# Patient Record
Sex: Male | Born: 1970 | Race: White | Hispanic: No | Marital: Married | Smoking: Current every day smoker
Health system: Southern US, Community
[De-identification: ages and names within clinical notes are randomized; demographics above are authoritative.]

## PROBLEM LIST (undated history)

## (undated) DIAGNOSIS — R51 Headache: Secondary | ICD-10-CM

## (undated) DIAGNOSIS — R519 Headache, unspecified: Secondary | ICD-10-CM

---

## 1994-02-21 HISTORY — PX: HERNIA REPAIR: SHX51

## 2007-02-22 HISTORY — PX: BACK SURGERY: SHX140

## 2007-07-10 ENCOUNTER — Ambulatory Visit (HOSPITAL_COMMUNITY): Admission: RE | Admit: 2007-07-10 | Discharge: 2007-07-11 | Payer: Self-pay | Admitting: Neurosurgery

## 2010-07-06 NOTE — Op Note (Signed)
NAMEREMBERT, BROWE               ACCOUNT NO.:  [id number]   MEDICAL RECORD NO.:  [id number]          PATIENT TYPE:  OIB   LOCATION:  3599                         FACILITY:  [facility]   PHYSICIAN:  Reinaldo Meeker, M.D. DATE OF BIRTH:  10-27-1970   DATE OF PROCEDURE:  07/10/2007  DATE OF DISCHARGE:                               OPERATIVE REPORT   PREOPERATIVE DIAGNOSIS:  Herniated disk L5-S1, right.   POSTOPERATIVE DIAGNOSIS:  Herniated disk L5-S1, right.   PROCEDURE:  Right L5-S1 interlaminar laminotomy for excision of  herniated disk with operative microscope.   SECONDARY PROCEDURE:  Microdissection, L5-S1 disk and S1 nerve root.   SURGEON:  Reinaldo Meeker, MD   ASSISTANT:  Kathaleen Maser. Pool, MD   PROCEDURE IN DETAIL:  After placing in the prone position, the patient's  back was prepped and draped in the usual sterile fashion.  Localizing x-  ray was taken prior to incision to identify the appropriate level.  Midline incision was made above the spinous processes of L5 and S1.  Using the Bovie cutting current, the incision was carried on the spinous  processes.  Subperiosteal dissection was then carried out along the  right side spinous processes and lamina.  Self-retaining retractor was  placed for exposure.  X-ray showed approach to the appropriate level.  Using a high-speed drill, the inferior one-third of the L5 lamina, the  medial one-third of the facet joint, and the superior one-third of the  S1 lamina were removed.  Residual bone ligamentum flavum removed in a  piecemeal fashion.  The microscope was draped and brought into the field  and used for remainder of the case.  Using microdissection technique,  the lateral aspect of the thecal sac and S1 nerve root were identified.  Further coagulation was carried down in the floor of the canal to  identify the L5-S1 disk, which was found remarkably herniated.  After  coagulating on the annulus, this was incised with #15-blade.   Using  pituitary rongeurs and curettes, thorough disk space clean-out was  carried out, at the same time great care was taken to avoid injury to  the neural elements.  This was successfully done.  At this point,  inspection was carried out in all directions for any evidence of  residual compression and none could be identified.  Large amounts of  irrigation was carried out.  Any bleeding was controlled with bipolar  coagulation and Gelfoam.  The wound was then closed in multiple layers  of Vicryl in the muscle fascia, subcutaneous, and subcuticular tissues,  and staples were placed on the skin.  A sterile dressing was then  applied, and the patient was extubated and taken recovery room in stable  condition.           ______________________________  Reinaldo Meeker, M.D.     ROK/MEDQ  D:  07/10/2007  T:  07/11/2007  Job:  [id number]

## 2015-03-25 ENCOUNTER — Other Ambulatory Visit: Payer: Self-pay | Admitting: Neurosurgery

## 2015-04-20 ENCOUNTER — Encounter (HOSPITAL_COMMUNITY): Payer: Self-pay

## 2015-04-20 ENCOUNTER — Encounter (HOSPITAL_COMMUNITY)
Admission: RE | Admit: 2015-04-20 | Discharge: 2015-04-20 | Disposition: A | Discharge status: Home / Self Care | Source: Ambulatory Visit | Attending: Neurosurgery | Admitting: Neurosurgery

## 2015-04-20 DIAGNOSIS — Z01812 Encounter for preprocedural laboratory examination: Secondary | ICD-10-CM | POA: Insufficient documentation

## 2015-04-20 DIAGNOSIS — M5127 Other intervertebral disc displacement, lumbosacral region: Secondary | ICD-10-CM | POA: Insufficient documentation

## 2015-04-20 HISTORY — DX: Headache, unspecified: R51.9

## 2015-04-20 HISTORY — DX: Headache: R51

## 2015-04-20 LAB — CBC
HCT: 44.1 % (ref 39.0–52.0)
Hemoglobin: 15.3 g/dL (ref 13.0–17.0)
MCH: 30.5 pg (ref 26.0–34.0)
MCHC: 34.7 g/dL (ref 30.0–36.0)
MCV: 87.8 fL (ref 78.0–100.0)
PLATELETS: 231 10*3/uL (ref 150–400)
RBC: 5.02 MIL/uL (ref 4.22–5.81)
RDW: 13.4 % (ref 11.5–15.5)
WBC: 8.3 10*3/uL (ref 4.0–10.5)

## 2015-04-20 LAB — BASIC METABOLIC PANEL
ANION GAP: 10 (ref 5–15)
BUN: 11 mg/dL (ref 6–20)
CALCIUM: 9.2 mg/dL (ref 8.9–10.3)
CO2: 25 mmol/L (ref 22–32)
Chloride: 106 mmol/L (ref 101–111)
Creatinine, Ser: 1.03 mg/dL (ref 0.61–1.24)
GFR calc Af Amer: >60 mL/min (ref >60)
GLUCOSE: 93 mg/dL (ref 65–99)
POTASSIUM: 4.2 mmol/L (ref 3.5–5.1)
SODIUM: 141 mmol/L (ref 135–145)

## 2015-04-20 LAB — SURGICAL PCR SCREEN
MRSA, PCR: NEGATIVE
STAPHYLOCOCCUS AUREUS: NEGATIVE

## 2015-04-20 NOTE — Progress Notes (Signed)
PCP is Dr. Madilyn Fireman Denies seeing a cardiologist denies ever having a stress test, card cath, or echo Ekg copy placed in chart

## 2015-04-20 NOTE — Pre-Procedure Instructions (Signed)
Nathan Hardin  04/20/2015      [facility] [place]in, [place] - [address] [facility] [place] [address] Phone: [phone] Fax: [phone]    Your procedure is scheduled on March 7.  Report to [facility] Admitting at 800 A.M.  Call this number if you have problems the morning of surgery:  [phone]   Remember:  Do not eat food or drink liquids after midnight.  Take these medicines the morning of surgery with A SIP OF WATER Hydrocodone or tylenol if needed   Stop taking Asprin, Ibuprofen, Advil, Motrin, Aleve, BC's, Goody's, Herbal medications,Fish oil    Do not wear jewelry, make-up or nail polish.  Do not wear lotions, powders, or perfumes.  You may wear deodorant.  Do not shave 48 hours prior to surgery.  Men may shave face and neck.  Do not bring valuables to the hospital.  [name] is not responsible for any belongings or valuables.  Contacts, dentures or bridgework may not be worn into surgery.  Leave your suitcase in the car.  After surgery it may be brought to your room.  For patients admitted to the hospital, discharge time will be determined by your treatment team.  Patients discharged the day of surgery will not be allowed to drive home.    Special instructions:  [name] - Preparing for Surgery  Before surgery, you can play an important role.  Because skin is not sterile, your skin needs to be as free of germs as possible.  You can reduce the number of germs on you skin by washing with CHG (chlorahexidine gluconate) soap before surgery.  CHG is an antiseptic cleaner which kills germs and bonds with the skin to continue killing germs even after washing.  Please DO NOT use if you have an allergy to CHG or antibacterial soaps.  If your skin becomes reddened/irritated stop using the CHG and inform your nurse when you arrive at Short Stay.  Do not shave (including legs and underarms) for at least 48  hours prior to the first CHG shower.  You may shave your face.  Please follow these instructions carefully:   1.  Shower with CHG Soap the night before surgery and the    morning of Surgery.  2.  If you choose to wash your hair, wash your hair first as usual with your  normal shampoo.  3.  After you shampoo, rinse your hair and body thoroughly to remove the  Shampoo.  4.  Use CHG as you would any other liquid soap.  You can apply chg directly    to the skin and wash gently with scrungie or a clean washcloth.  5.  Apply the CHG Soap to your body ONLY FROM THE NECK DOWN.        Do not use on open wounds or open sores.  Avoid contact with your eyes,  ears, mouth and genitals (private parts).  Wash genitals (private parts)  with your normal soap.  6.  Wash thoroughly, paying special attention to the area where your surgery  will be performed.  7.  Thoroughly rinse your body with warm water from the neck down.  8.  DO NOT shower/wash with your normal soap after using and rinsing off  the CHG Soap.  9.  Pat yourself dry with a clean towel.            10.  Wear clean pajamas.  11.  Place clean sheets on your bed the night of your first shower and do not  sleep with pets.  Day of Surgery  Do not apply any lotions/deoderants the morning of surgery.  Please wear clean clothes to the hospital/surgery center.     Please read over the following fact sheets that you were given. Pain Booklet, Coughing and Deep Breathing, MRSA Information and Surgical Site Infection Prevention

## 2015-04-20 NOTE — Progress Notes (Signed)
Not here for PAT appointment message left for pt to return call.

## 2015-04-27 NOTE — Progress Notes (Signed)
Patient notified to arrive at 411.

## 2015-04-28 ENCOUNTER — Ambulatory Visit (HOSPITAL_COMMUNITY): Admitting: Certified Registered Nurse Anesthetist

## 2015-04-28 ENCOUNTER — Observation Stay (HOSPITAL_COMMUNITY)
Admission: RE | Admit: 2015-04-28 | Discharge: 2015-04-28 | Disposition: A | Discharge status: Home / Self Care | Source: Ambulatory Visit | Attending: Neurosurgery | Admitting: Neurosurgery

## 2015-04-28 ENCOUNTER — Encounter (HOSPITAL_COMMUNITY)
Admission: RE | Disposition: A | Discharge status: Home / Self Care | Payer: Self-pay | Source: Ambulatory Visit | Attending: Neurosurgery

## 2015-04-28 ENCOUNTER — Ambulatory Visit (HOSPITAL_COMMUNITY)

## 2015-04-28 ENCOUNTER — Encounter (HOSPITAL_COMMUNITY): Payer: Self-pay | Admitting: *Deleted

## 2015-04-28 DIAGNOSIS — M5126 Other intervertebral disc displacement, lumbar region: Secondary | ICD-10-CM | POA: Diagnosis present

## 2015-04-28 DIAGNOSIS — M5117 Intervertebral disc disorders with radiculopathy, lumbosacral region: Secondary | ICD-10-CM | POA: Diagnosis not present

## 2015-04-28 DIAGNOSIS — F17210 Nicotine dependence, cigarettes, uncomplicated: Secondary | ICD-10-CM | POA: Insufficient documentation

## 2015-04-28 DIAGNOSIS — Z419 Encounter for procedure for purposes other than remedying health state, unspecified: Secondary | ICD-10-CM

## 2015-04-28 HISTORY — PX: LUMBAR LAMINECTOMY/DECOMPRESSION MICRODISCECTOMY: SHX5026

## 2015-04-28 SURGERY — LUMBAR LAMINECTOMY/DECOMPRESSION MICRODISCECTOMY 1 LEVEL
Anesthesia: General | Site: Back | Laterality: Right

## 2015-04-28 MED ORDER — CYCLOBENZAPRINE HCL 10 MG PO TABS: 10.0000 mg | ORAL_TABLET | Freq: Three times a day (TID) | ORAL | Status: AC | PRN

## 2015-04-28 MED ORDER — ONDANSETRON HCL 4 MG/2ML IJ SOLN
INTRAMUSCULAR | Status: AC
  Filled 2015-04-28: qty 2

## 2015-04-28 MED ORDER — LACTATED RINGERS IV SOLN: INTRAVENOUS | Status: DC

## 2015-04-28 MED ORDER — FENTANYL CITRATE (PF) 250 MCG/5ML IJ SOLN
INTRAMUSCULAR | Status: AC
  Filled 2015-04-28: qty 5

## 2015-04-28 MED ORDER — ACETAMINOPHEN 325 MG PO TABS: 650.0000 mg | ORAL_TABLET | ORAL | Status: DC | PRN

## 2015-04-28 MED ORDER — SODIUM CHLORIDE 0.9 % IR SOLN
Status: DC | PRN
  Administered 2015-04-28: 13:00:00

## 2015-04-28 MED ORDER — HYDROMORPHONE HCL 1 MG/ML IJ SOLN
0.2500 mg | INTRAMUSCULAR | Status: DC | PRN
  Administered 2015-04-28: 0.5 mg via INTRAVENOUS

## 2015-04-28 MED ORDER — KETOROLAC TROMETHAMINE 30 MG/ML IJ SOLN
INTRAMUSCULAR | Status: DC | PRN
  Administered 2015-04-28: 30 mg via INTRAVENOUS

## 2015-04-28 MED ORDER — DEXAMETHASONE SODIUM PHOSPHATE 10 MG/ML IJ SOLN
INTRAMUSCULAR | Status: AC
  Filled 2015-04-28: qty 1

## 2015-04-28 MED ORDER — CEFAZOLIN SODIUM-DEXTROSE 2-3 GM-% IV SOLR
2.0000 g | INTRAVENOUS | Status: AC
  Administered 2015-04-28: 2 g via INTRAVENOUS

## 2015-04-28 MED ORDER — SODIUM CHLORIDE 0.9% FLUSH: 3.0000 mL | INTRAVENOUS | Status: DC | PRN

## 2015-04-28 MED ORDER — CYCLOBENZAPRINE HCL 10 MG PO TABS: 10.0000 mg | ORAL_TABLET | Freq: Three times a day (TID) | ORAL | Status: DC | PRN

## 2015-04-28 MED ORDER — PROPOFOL 10 MG/ML IV BOLUS
INTRAVENOUS | Status: AC
  Filled 2015-04-28: qty 20

## 2015-04-28 MED ORDER — HYDROCODONE-ACETAMINOPHEN 5-325 MG PO TABS: 1.0000 | ORAL_TABLET | Freq: Four times a day (QID) | ORAL | Status: AC | PRN

## 2015-04-28 MED ORDER — LIDOCAINE HCL (CARDIAC) 20 MG/ML IV SOLN
INTRAVENOUS | Status: AC
  Filled 2015-04-28: qty 5

## 2015-04-28 MED ORDER — GLYCOPYRROLATE 0.2 MG/ML IJ SOLN
INTRAMUSCULAR | Status: AC
  Filled 2015-04-28: qty 1

## 2015-04-28 MED ORDER — THROMBIN 5000 UNITS EX SOLR
CUTANEOUS | Status: DC | PRN
  Administered 2015-04-28 (×2): 5000 [IU] via TOPICAL

## 2015-04-28 MED ORDER — MENTHOL 3 MG MT LOZG: 1.0000 | LOZENGE | OROMUCOSAL | Status: DC | PRN

## 2015-04-28 MED ORDER — HYDROMORPHONE HCL 1 MG/ML IJ SOLN: 0.5000 mg | INTRAMUSCULAR | Status: DC | PRN

## 2015-04-28 MED ORDER — LACTATED RINGERS IV SOLN
INTRAVENOUS | Status: DC
  Administered 2015-04-28 (×2): via INTRAVENOUS

## 2015-04-28 MED ORDER — KETOROLAC TROMETHAMINE 30 MG/ML IJ SOLN: 30.0000 mg | Freq: Four times a day (QID) | INTRAMUSCULAR | Status: DC

## 2015-04-28 MED ORDER — LIDOCAINE HCL (CARDIAC) 20 MG/ML IV SOLN
INTRAVENOUS | Status: DC | PRN
  Administered 2015-04-28: 100 mg via INTRAVENOUS

## 2015-04-28 MED ORDER — FENTANYL CITRATE (PF) 100 MCG/2ML IJ SOLN
INTRAMUSCULAR | Status: DC | PRN
  Administered 2015-04-28: 100 ug via INTRAVENOUS
  Administered 2015-04-28 (×3): 50 ug via INTRAVENOUS

## 2015-04-28 MED ORDER — HYDROMORPHONE HCL 1 MG/ML IJ SOLN
INTRAMUSCULAR | Status: AC
  Filled 2015-04-28: qty 1

## 2015-04-28 MED ORDER — SUGAMMADEX SODIUM 200 MG/2ML IV SOLN
INTRAVENOUS | Status: AC
  Filled 2015-04-28: qty 2

## 2015-04-28 MED ORDER — OXYCODONE-ACETAMINOPHEN 5-325 MG PO TABS
1.0000 | ORAL_TABLET | ORAL | Status: DC | PRN
  Administered 2015-04-28: 2 via ORAL
  Filled 2015-04-28: qty 2

## 2015-04-28 MED ORDER — ROCURONIUM BROMIDE 50 MG/5ML IV SOLN
INTRAVENOUS | Status: AC
  Filled 2015-04-28: qty 1

## 2015-04-28 MED ORDER — SODIUM CHLORIDE 0.9% FLUSH: 3.0000 mL | Freq: Two times a day (BID) | INTRAVENOUS | Status: DC

## 2015-04-28 MED ORDER — CEFAZOLIN SODIUM 1-5 GM-% IV SOLN: 1.0000 g | Freq: Three times a day (TID) | INTRAVENOUS | Status: DC

## 2015-04-28 MED ORDER — ARTIFICIAL TEARS OP OINT
TOPICAL_OINTMENT | OPHTHALMIC | Status: AC
  Filled 2015-04-28: qty 3.5

## 2015-04-28 MED ORDER — PROPOFOL 10 MG/ML IV BOLUS
INTRAVENOUS | Status: DC | PRN
  Administered 2015-04-28: 200 mg via INTRAVENOUS
  Administered 2015-04-28 (×2): 50 mg via INTRAVENOUS

## 2015-04-28 MED ORDER — SUGAMMADEX SODIUM 200 MG/2ML IV SOLN
INTRAVENOUS | Status: DC | PRN
  Administered 2015-04-28: 170 mg via INTRAVENOUS

## 2015-04-28 MED ORDER — DIPHENHYDRAMINE HCL 50 MG/ML IJ SOLN
INTRAMUSCULAR | Status: AC
  Filled 2015-04-28: qty 1

## 2015-04-28 MED ORDER — ROCURONIUM BROMIDE 100 MG/10ML IV SOLN
INTRAVENOUS | Status: DC | PRN
  Administered 2015-04-28: 50 mg via INTRAVENOUS

## 2015-04-28 MED ORDER — PROPOFOL 10 MG/ML IV BOLUS
INTRAVENOUS | Status: AC
  Filled 2015-04-28: qty 40

## 2015-04-28 MED ORDER — MIDAZOLAM HCL 2 MG/2ML IJ SOLN
INTRAMUSCULAR | Status: AC
  Filled 2015-04-28: qty 2

## 2015-04-28 MED ORDER — PHENOL 1.4 % MT LIQD: 1.0000 | OROMUCOSAL | Status: DC | PRN

## 2015-04-28 MED ORDER — 0.9 % SODIUM CHLORIDE (POUR BTL) OPTIME
TOPICAL | Status: DC | PRN
  Administered 2015-04-28: 1000 mL

## 2015-04-28 MED ORDER — ONDANSETRON HCL 4 MG/2ML IJ SOLN: 4.0000 mg | INTRAMUSCULAR | Status: DC | PRN

## 2015-04-28 MED ORDER — ARTIFICIAL TEARS OP OINT
TOPICAL_OINTMENT | OPHTHALMIC | Status: AC
  Filled 2015-04-28: qty 7

## 2015-04-28 MED ORDER — CEFAZOLIN SODIUM-DEXTROSE 2-3 GM-% IV SOLR
INTRAVENOUS | Status: AC
  Filled 2015-04-28: qty 50

## 2015-04-28 MED ORDER — DIPHENHYDRAMINE HCL 50 MG/ML IJ SOLN
INTRAMUSCULAR | Status: DC | PRN
  Administered 2015-04-28: 25 mg via INTRAVENOUS

## 2015-04-28 MED ORDER — DEXAMETHASONE SODIUM PHOSPHATE 10 MG/ML IJ SOLN
INTRAMUSCULAR | Status: DC | PRN
  Administered 2015-04-28: 10 mg via INTRAVENOUS

## 2015-04-28 MED ORDER — ARTIFICIAL TEARS OP OINT
TOPICAL_OINTMENT | OPHTHALMIC | Status: DC | PRN
  Administered 2015-04-28: 1 via OPHTHALMIC

## 2015-04-28 MED ORDER — KETOROLAC TROMETHAMINE 30 MG/ML IJ SOLN
INTRAMUSCULAR | Status: AC
  Filled 2015-04-28: qty 1

## 2015-04-28 MED ORDER — HYDROCODONE-ACETAMINOPHEN 5-325 MG PO TABS: 1.0000 | ORAL_TABLET | ORAL | Status: DC | PRN

## 2015-04-28 MED ORDER — ONDANSETRON HCL 4 MG/2ML IJ SOLN
INTRAMUSCULAR | Status: DC | PRN
  Administered 2015-04-28: 4 mg via INTRAVENOUS

## 2015-04-28 MED ORDER — MIDAZOLAM HCL 5 MG/5ML IJ SOLN
INTRAMUSCULAR | Status: DC | PRN
  Administered 2015-04-28: 2 mg via INTRAVENOUS

## 2015-04-28 MED ORDER — ACETAMINOPHEN 650 MG RE SUPP: 650.0000 mg | RECTAL | Status: DC | PRN

## 2015-04-28 MED ORDER — HEMOSTATIC AGENTS (NO CHARGE) OPTIME
TOPICAL | Status: DC | PRN
  Administered 2015-04-28: 1 via TOPICAL

## 2015-04-28 MED ORDER — BUPIVACAINE HCL (PF) 0.25 % IJ SOLN
INTRAMUSCULAR | Status: DC | PRN
  Administered 2015-04-28: 20 mL

## 2015-04-28 SURGICAL SUPPLY — 49 items
BAG DECANTER FOR FLEXI CONT (MISCELLANEOUS) ×3 IMPLANT
BENZOIN TINCTURE PRP APPL 2/3 (GAUZE/BANDAGES/DRESSINGS) ×3 IMPLANT
BLADE CLIPPER SURG (BLADE) IMPLANT
BRUSH SCRUB EZ PLAIN DRY (MISCELLANEOUS) ×3 IMPLANT
BUR CUTTER 7.0 ROUND (BURR) ×3 IMPLANT
CANISTER SUCT 3000ML PPV (MISCELLANEOUS) ×3 IMPLANT
CLOSURE WOUND 1/2 X4 (GAUZE/BANDAGES/DRESSINGS) ×1
DECANTER SPIKE VIAL GLASS SM (MISCELLANEOUS) ×3 IMPLANT
DRAPE LAPAROTOMY 100X72X124 (DRAPES) ×3 IMPLANT
DRAPE MICROSCOPE LEICA (MISCELLANEOUS) ×3 IMPLANT
DRAPE POUCH INSTRU U-SHP 10X18 (DRAPES) ×3 IMPLANT
DRAPE PROXIMA HALF (DRAPES) IMPLANT
DRAPE SURG 17X23 STRL (DRAPES) ×6 IMPLANT
DRSG OPSITE POSTOP 3X4 (GAUZE/BANDAGES/DRESSINGS) ×3 IMPLANT
DURAPREP 26ML APPLICATOR (WOUND CARE) ×3 IMPLANT
ELECT REM PT RETURN 9FT ADLT (ELECTROSURGICAL) ×3
ELECTRODE REM PT RTRN 9FT ADLT (ELECTROSURGICAL) ×1 IMPLANT
GAUZE SPONGE 4X4 12PLY STRL (GAUZE/BANDAGES/DRESSINGS) ×3 IMPLANT
GAUZE SPONGE 4X4 16PLY XRAY LF (GAUZE/BANDAGES/DRESSINGS) IMPLANT
GLOVE BIOGEL M 8.0 STRL (GLOVE) ×3 IMPLANT
GLOVE BIOGEL PI IND STRL 7.0 (GLOVE) ×2 IMPLANT
GLOVE BIOGEL PI INDICATOR 7.0 (GLOVE) ×4
GLOVE ECLIPSE 9.0 STRL (GLOVE) ×3 IMPLANT
GLOVE EXAM NITRILE LRG STRL (GLOVE) IMPLANT
GLOVE EXAM NITRILE MD LF STRL (GLOVE) IMPLANT
GLOVE EXAM NITRILE XL STR (GLOVE) IMPLANT
GLOVE EXAM NITRILE XS STR PU (GLOVE) IMPLANT
GLOVE SURG SS PI 6.5 STRL IVOR (GLOVE) ×6 IMPLANT
GOWN STRL REUS W/ TWL LRG LVL3 (GOWN DISPOSABLE) IMPLANT
GOWN STRL REUS W/ TWL XL LVL3 (GOWN DISPOSABLE) ×1 IMPLANT
GOWN STRL REUS W/TWL 2XL LVL3 (GOWN DISPOSABLE) IMPLANT
GOWN STRL REUS W/TWL LRG LVL3 (GOWN DISPOSABLE)
GOWN STRL REUS W/TWL XL LVL3 (GOWN DISPOSABLE) ×2
KIT BASIN OR (CUSTOM PROCEDURE TRAY) ×3 IMPLANT
KIT ROOM TURNOVER OR (KITS) ×3 IMPLANT
LIQUID BAND (GAUZE/BANDAGES/DRESSINGS) ×3 IMPLANT
NEEDLE HYPO 22GX1.5 SAFETY (NEEDLE) ×3 IMPLANT
NEEDLE SPNL 22GX3.5 QUINCKE BK (NEEDLE) ×3 IMPLANT
NS IRRIG 1000ML POUR BTL (IV SOLUTION) ×3 IMPLANT
PACK LAMINECTOMY NEURO (CUSTOM PROCEDURE TRAY) ×3 IMPLANT
PAD ARMBOARD 7.5X6 YLW CONV (MISCELLANEOUS) ×9 IMPLANT
RUBBERBAND STERILE (MISCELLANEOUS) ×6 IMPLANT
SPONGE SURGIFOAM ABS GEL SZ50 (HEMOSTASIS) ×3 IMPLANT
STRIP CLOSURE SKIN 1/2X4 (GAUZE/BANDAGES/DRESSINGS) ×2 IMPLANT
SUT VIC AB 2-0 CT1 18 (SUTURE) ×3 IMPLANT
SUT VIC AB 3-0 SH 8-18 (SUTURE) ×6 IMPLANT
TOWEL OR 17X24 6PK STRL BLUE (TOWEL DISPOSABLE) ×3 IMPLANT
TOWEL OR 17X26 10 PK STRL BLUE (TOWEL DISPOSABLE) ×3 IMPLANT
WATER STERILE IRR 1000ML POUR (IV SOLUTION) ×3 IMPLANT

## 2015-04-28 NOTE — H&P (Signed)
  Nathan Hardin is an 45 y.o. male.   Chief Complaint: Right lower extremity pain HPI: 45 year old male status post previous right-sided L5-S1 laminotomy and microdiscectomy. Patient presents with recurrent back and right lower extremity pain. Workup demonstrates evidence of a large focal right L5-S1 recurrent disc herniation with marked S1 nerve root compression. Patient failed conservative management and presents now for right L5-S1 redo laminotomy and microdiscectomy.  Past Medical History  Diagnosis Date  . Headache     Past Surgical History  Procedure Laterality Date  . Hernia repair  1996  . Back surgery  2009    History reviewed. No pertinent family history. Social History:  reports that he has been smoking Cigarettes.  He has a 13 pack-year smoking history. He does not have any smokeless tobacco history on file. He reports that he drinks alcohol. He reports that he does not use illicit drugs.  Allergies: No Known Allergies  Medications Prior to Admission  Medication Sig Dispense Refill  . acetaminophen (TYLENOL) 325 MG tablet Take 650 mg by mouth every 6 (six) hours as needed for mild pain or moderate pain.    Marland Kitchen. acetaminophen (TYLENOL) 500 MG tablet Take 500 mg by mouth every 6 (six) hours as needed.    Marland Kitchen. HYDROcodone-acetaminophen (NORCO/VICODIN) 5-325 MG tablet Take 1 tablet by mouth every 6 (six) hours as needed. for pain  0  . ibuprofen (ADVIL,MOTRIN) 200 MG tablet Take 600 mg by mouth every 6 (six) hours as needed for mild pain.      No results found for this or any previous visit (from the past 48 hour(s)). No results found.  Pertinent items noted in HPI and remainder of comprehensive ROS otherwise negative.  Blood pressure 132/76, pulse 66, temperature 98.3 F (36.8 C), temperature source Oral, resp. rate 20, weight 84.823 kg (187 lb), SpO2 100 %.  The patient is awake and alert. He is oriented and appropriate. His speech is fluent. His judgment and insight are  intact. He is obviously uncomfortable. Examination of his head ears eyes and throat is unremarkable. Chest and abdomen are benign. Extremities are free from injury or deformity. Neurologically his motor examination reveals weakness of his right-sided gastrocnemius muscle otherwise motor exams intact. Sensory examination reveals decreased sensation to pinprick and light touch in his right S1 dermatome. Deep tendon reflexes are normal active except his right Achilles reflex is absent. He has a positive straight leg raise on the right side. Remainder his neurological exam is unremarkable. 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 Plan right L5-S1 redo laminotomy and microdiscectomy. Risks and benefits of been explained. Patient wishes to proceed.  Zahid Carneiro A 04/28/2015, 12:25 PM

## 2015-04-28 NOTE — Discharge Summary (Signed)
Physician Discharge Summary  Patient ID: Nathan HaltDennis B Hardin MRN: [id number] DOB/AGE: [birth year] 47 y.o.  Admit date: 04/28/2015 Discharge date: 04/28/2015  Admission Diagnoses:  Discharge Diagnoses:  Active Problems:   HNP (herniated nucleus pulposus), lumbar   Discharged Condition: good  Hospital Course: Patient admitted to the hospital where he underwent an uncomplicated right-sided L5-S1 redo laminotomy and microdiscectomy. Postoperatively doing well. Back and leg pain much improved. Ready for discharge home.  Consults:   Significant Diagnostic Studies:   Treatments:   Discharge Exam: Blood pressure 140/84, pulse 81, temperature 98.1 F (36.7 C), temperature source Oral, resp. rate 16, weight 84.823 kg (187 lb), SpO2 96 %. Awake and alert. Oriented and appropriate. Wound clean and dry. Chest and abdomen benign. Motor and sensory intact.  Disposition:      Medication List    TAKE these medications        acetaminophen 500 MG tablet  Commonly known as:  TYLENOL  Take 500 mg by mouth every 6 (six) hours as needed.     acetaminophen 325 MG tablet  Commonly known as:  TYLENOL  Take 650 mg by mouth every 6 (six) hours as needed for mild pain or moderate pain.     cyclobenzaprine 10 MG tablet  Commonly known as:  FLEXERIL  Take 1 tablet (10 mg total) by mouth 3 (three) times daily as needed for muscle spasms.     HYDROcodone-acetaminophen 5-325 MG tablet  Commonly known as:  NORCO/VICODIN  Take 1-2 tablets by mouth every 6 (six) hours as needed. for pain     ibuprofen 200 MG tablet  Commonly known as:  ADVIL,MOTRIN  Take 600 mg by mouth every 6 (six) hours as needed for mild pain.           Follow-up Information    Follow up with Temple PaciniPOOL,Tuleen Mandelbaum A, MD.   Specialty:  Neurosurgery   Contact information:   [address] [place] [address] [phone]       Signed: Temple PaciniOOL,Tenee Wish A 04/28/2015, 4:44 PM

## 2015-04-28 NOTE — Discharge Instructions (Signed)

## 2015-04-28 NOTE — Transfer of Care (Signed)
Immediate Anesthesia Transfer of Care Note  Patient: Nathan Hardin  Procedure(s) Performed: Procedure(s) with comments: Right L5-S1 Redo Microdiskectomy (Right) - Right Lumbar five-Sacral one Redo Microdiskectomy  Patient Location: PACU  Anesthesia Type:General  Level of Consciousness: awake, alert , oriented and patient cooperative  Airway & Oxygen Therapy: Patient Spontanous Breathing and Patient connected to face mask oxygen  Post-op Assessment: Report given to RN, Post -op Vital signs reviewed and stable and Patient moving all extremities  Post vital signs: Reviewed and stable  Last Vitals:  Filed Vitals:   04/28/15 1435 04/28/15 1440  BP:    Pulse: 65 77  Temp:    Resp: 20 15    Complications: No apparent anesthesia complications

## 2015-04-28 NOTE — Anesthesia Postprocedure Evaluation (Signed)
Anesthesia Post Note  Patient: Nathan Hardin  Procedure(s) Performed: Procedure(s) (LRB): Right L5-S1 Redo Microdiskectomy (Right)  Patient location during evaluation: PACU Anesthesia Type: General Level of consciousness: awake and alert Pain management: pain level controlled Vital Signs Assessment: post-procedure vital signs reviewed and stable Respiratory status: spontaneous breathing, nonlabored ventilation, respiratory function stable and patient connected to nasal cannula oxygen Cardiovascular status: blood pressure returned to baseline and stable Postop Assessment: no signs of nausea or vomiting Anesthetic complications: no    Last Vitals:  Filed Vitals:   04/28/15 1548 04/28/15 1616  BP: 133/81 140/84  Pulse: 75 81  Temp: 36.2 C 36.7 C  Resp: 19 16    Last Pain:  Filed Vitals:   04/28/15 1926  PainSc: 3                  Legend Tumminello L

## 2015-04-28 NOTE — Anesthesia Preprocedure Evaluation (Addendum)
Anesthesia Evaluation  Patient identified by MRN, date of birth, ID band Patient awake    Reviewed: Allergy & Precautions, H&P , NPO status , Patient's Chart, lab work & pertinent test results  Airway Mallampati: II  TM Distance: >3 FB Neck ROM: full    Dental  (+) Dental Advisory Given, Caps All upper front are capped:   Pulmonary Current Smoker,    Pulmonary exam normal breath sounds clear to auscultation       Cardiovascular Exercise Tolerance: Good negative cardio ROS Normal cardiovascular exam Rhythm:regular Rate:Normal     Neuro/Psych negative neurological ROS  negative psych ROS   GI/Hepatic negative GI ROS, Neg liver ROS,   Endo/Other  negative endocrine ROS  Renal/GU negative Renal ROS  negative genitourinary   Musculoskeletal   Abdominal   Peds  Hematology negative hematology ROS (+)   Anesthesia Other Findings   Reproductive/Obstetrics negative OB ROS                           BP Readings from Last 3 Encounters:  04/28/15 132/76  04/20/15 127/85   Lab Results  Component Value Date   NA 141 04/20/2015   K 4.2 04/20/2015   CL 106 04/20/2015   CO2 25 04/20/2015   CBC    Component Value Date/Time   WBC 8.3 04/20/2015 1451   RBC 5.02 04/20/2015 1451   HGB 15.3 04/20/2015 1451   HCT 44.1 04/20/2015 1451   PLT 231 04/20/2015 1451   MCV 87.8 04/20/2015 1451   MCH 30.5 04/20/2015 1451   MCHC 34.7 04/20/2015 1451   RDW 13.4 04/20/2015 1451   No results found for: PTT    Anesthesia Physical Anesthesia Plan  ASA: II  Anesthesia Plan: General   Post-op Pain Management:    Induction: Intravenous  Airway Management Planned: Oral ETT  Additional Equipment:   Intra-op Plan:   Post-operative Plan: Extubation in OR  Informed Consent: I have reviewed the patients History and Physical, chart, labs and discussed the procedure including the risks, benefits and  alternatives for the proposed anesthesia with the patient or authorized representative who has indicated his/her understanding and acceptance.   Dental Advisory Given  Plan Discussed with: CRNA and Surgeon  Anesthesia Plan Comments:         Anesthesia Quick Evaluation

## 2015-04-28 NOTE — Progress Notes (Signed)
Patient alert and oriented, mae's well, voiding adequate amount of urine, swallowing without difficulty, no c/o pain. Patient discharged home with family. Script and discharged instructions given to patient. Patient and family stated understanding of d/c instructions given and has an appointment with MD. 

## 2015-04-28 NOTE — Brief Op Note (Signed)
04/28/2015  2:17 PM  PATIENT:  Nathan Hardin  45 y.o. 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  PROCEDURE:  Procedure(s) with comments: Right L5-S1 Redo Microdiskectomy (Right) - Right Lumbar five-Sacral one Redo Microdiskectomy  SURGEON:  Surgeon(s) and Role:    * Julio SicksHenry Arlenne Kimbley, MD - Primary    * Hilda LiasErnesto Botero, MD - Assisting  PHYSICIAN ASSISTANT:   ASSISTANTS:    ANESTHESIA:   general  EBL:  Total I/O In: -  Out: 25 [Blood:25]  BLOOD ADMINISTERED:none  DRAINS: none   LOCAL MEDICATIONS USED:  MARCAINE     SPECIMEN:  No Specimen  DISPOSITION OF SPECIMEN:  N/A  COUNTS:  YES  TOURNIQUET:  * No tourniquets in log *  DICTATION: .Dragon Dictation  PLAN OF CARE: Admit to inpatient   PATIENT DISPOSITION:  PACU - hemodynamically stable.   Delay start of Pharmacological VTE agent (>24hrs) due to surgical blood loss or risk of bleeding: yes

## 2015-04-28 NOTE — Anesthesia Procedure Notes (Signed)
Procedure Name: Intubation Date/Time: 04/28/2015 12:52 PM Performed by: Fabian NovemberSOLHEIM, Gevin Perea SALOMAN Pre-anesthesia Checklist: Patient identified, Timeout performed, Emergency Drugs available, Suction available and Patient being monitored Patient Re-evaluated:Patient Re-evaluated prior to inductionOxygen Delivery Method: Circle system utilized Preoxygenation: Pre-oxygenation with 100% oxygen Intubation Type: IV induction Ventilation: Mask ventilation with difficulty, Two handed mask ventilation required and Oral airway inserted - appropriate to patient size Laryngoscope Size: Glidescope, 4 and Mac Grade View: Grade I Tube type: Oral Tube size: 7.5 mm Number of attempts: 3 Airway Equipment and Method: Stylet and Video-laryngoscopy Placement Confirmation: ETT inserted through vocal cords under direct vision,  breath sounds checked- equal and bilateral and positive ETCO2 Secured at: 21 cm Tube secured with: Tape Dental Injury: Teeth and Oropharynx as per pre-operative assessment  Difficulty Due To: Difficulty was unanticipated and Difficult Airway- due to anterior larynx Future Recommendations: Recommend- induction with short-acting agent, and alternative techniques readily available Comments: First DL with Satter 3 --> grade 4 view, second DL with MAC 3 --> grade 4 view. AOI with glidescope.

## 2015-04-28 NOTE — Op Note (Signed)
Date of procedure: 04/28/2015   Date of dictation: Same  Service: Neurosurgery  Preoperative diagnosis: Right L5-S1 recurrent herniated mucous pulposis with radiculopathy  Postoperative diagnosis: Same  Procedure Name: Reexploration right L5-S1 laminotomy with redo microdiscectomy  Surgeon:Lada Fulbright A.Lionell Matuszak, M.D.  Asst. Surgeon: Jeral FruitBotero  Anesthesia: General  Indication: 45 year old male status post previous right-sided L5-S1 laminotomy microdiscectomy presents now with recurrent right lower extremity pain paresthesias and weakness consistent with a right-sided S1 radiculopathy which has failed conservative management. Workup demonstrates evidence of a sizable right L5-S1 recurrent disc herniation beneath the level of the disc space causing marked compression of the right S1 nerve root. Patient presents now for redo microdiscectomy.  Operative note: After induction of anesthesia, patient position prone onto Wilson frame and a properly padded. Lumbar region prepped and draped sterilely. Incision made overlying L5-S1. Dissection performed on the right side. Retractor placed. X-ray taken. Level confirmed. Previous laminotomy site dissected free of epidural scar. Laminotomy widened using Kerrison rongeurs. Underlying thecal sac and right S1 nerve root identified. Microscope brought field these were microdissection. Thecal sac and S1 nerve root gently mobilized. The plane beneath the S1 nerve root was developed and a number of fragments of disc herniation were encountered however there was still tethering of the nerve root itself. The disc space was isolated and incised. I entered the disc space and removed all loose her oxygen degenerative disc tear from the interspace. The thecal sac and S1 nerve root were mobilized further. Additional disc herniation was encountered and removed. At this point a very thorough discectomy been achieved. There was no evidence of injury to the thecal sac and nerve roots. Wound  is then irrigated with and bike solution. Gelfoam placed over the laminotomy defect. Wounds and close in layers with Vicryl sutures. Steri-Strips and sterile dressing were applied. There were no apparent complications. The patient tolerated the procedure well and he returns to the recovery room postop.

## 2015-04-29 ENCOUNTER — Encounter (HOSPITAL_COMMUNITY): Payer: Self-pay | Admitting: Neurosurgery

## 2017-05-08 IMAGING — CR DG LUMBAR SPINE 1V
1 series · 1 of 1 positions shown · non-contrast
Comparison: None.

CLINICAL DATA: Right L5-S1 microdiskectomy

EXAM:
LUMBAR SPINE - 1 VIEW

[lat]
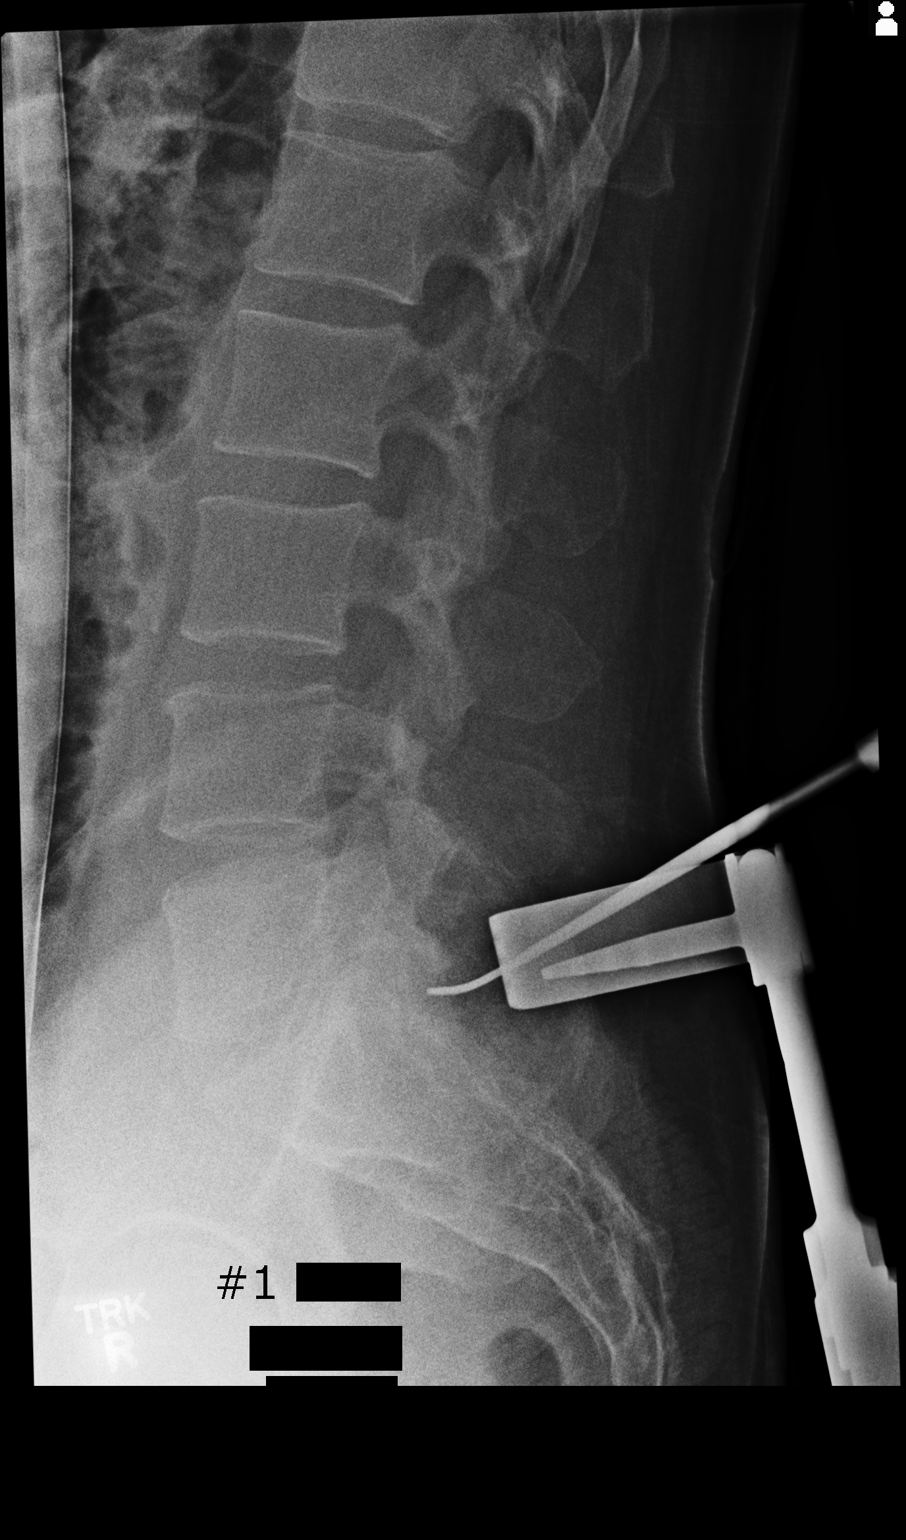

[1 of 1 positions shown; findings below may reference images not displayed]

FINDINGS: Single cross-table lateral x-ray of the lumbar spine. Posterior
tissue retractors with a curved tip metallic probe projecting over
the inferior articulating facet of L5. Vertebral body heights are
otherwise maintained.
IMPRESSION: Intraoperative localization x-ray as described above.
